# Patient Record
Sex: Female | Born: 1955 | Race: White | Hispanic: No | Marital: Married | Smoking: Never smoker
Health system: Southern US, Community
[De-identification: ages and names within clinical notes are randomized; demographics above are authoritative.]

## PROBLEM LIST (undated history)

## (undated) DIAGNOSIS — I10 Essential (primary) hypertension: Secondary | ICD-10-CM

---

## 2000-11-11 ENCOUNTER — Other Ambulatory Visit: Admission: RE | Admit: 2000-11-11 | Discharge: 2000-11-11 | Payer: Self-pay | Admitting: Internal Medicine

## 2005-01-22 ENCOUNTER — Ambulatory Visit: Payer: Self-pay | Admitting: Oncology

## 2005-01-22 ENCOUNTER — Encounter: Admission: RE | Admit: 2005-01-22 | Discharge: 2005-01-22 | Payer: Self-pay | Admitting: Surgery

## 2005-02-04 ENCOUNTER — Ambulatory Visit (HOSPITAL_COMMUNITY): Admission: RE | Admit: 2005-02-04 | Discharge: 2005-02-04 | Payer: Self-pay | Admitting: Oncology

## 2005-02-05 ENCOUNTER — Ambulatory Visit (HOSPITAL_COMMUNITY): Admission: RE | Admit: 2005-02-05 | Discharge: 2005-02-05 | Payer: Self-pay | Admitting: Oncology

## 2006-01-11 IMAGING — CT CT ABDOMEN W/ CM
1 of 4 series · 14 of 32 positions shown, 19 images · IV contrast (omnipaque)
Comparison: Nuclear medicine PET CT dated 02/04/2005 and breast MRI dated
01/22/2005.

CHEST CT WITH CONTRAST

CLINICAL DATA: Newly diagnosed right breast cancer.
TECHNIQUE: Multidetector CT imaging of the chest, abdomen, and pelvis was
performed following the standard protocol during bolus administration of
intravenous contrast.

Contrast:  150 cc Omnipaque 300

[Series 2: cap w/iv 5.0 b30f · axial · 0.74mm/px · z∈[-487,+43]mm · 14 of 122 slices shown, 19 images]
[im 8/122  soft-tissue]
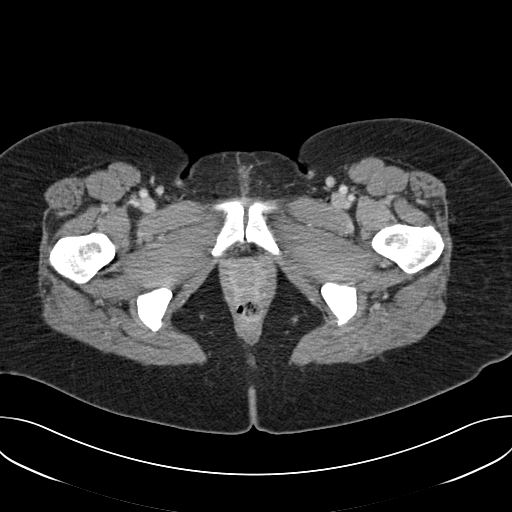
[im 8/122  bone]
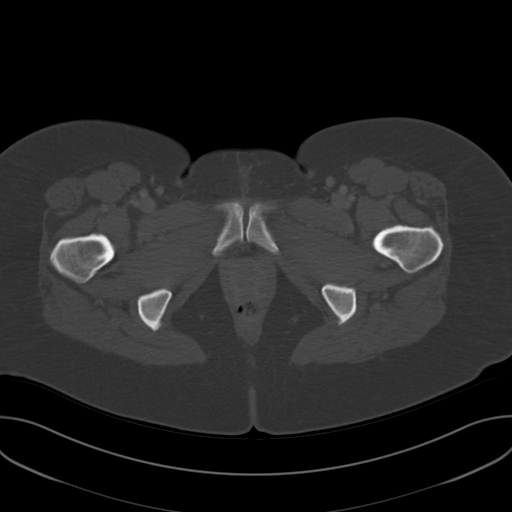
[im 16/122  soft-tissue]
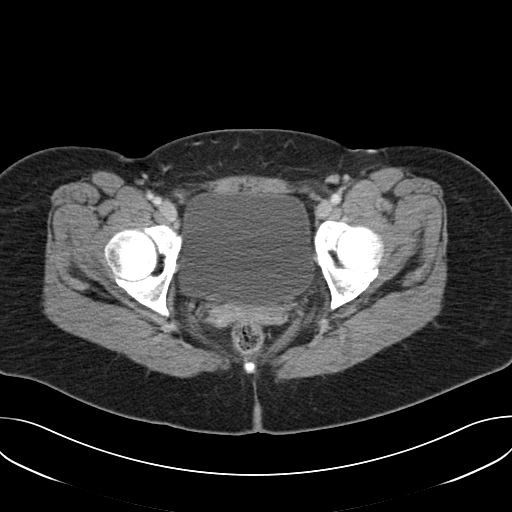
[im 23/122  soft-tissue]
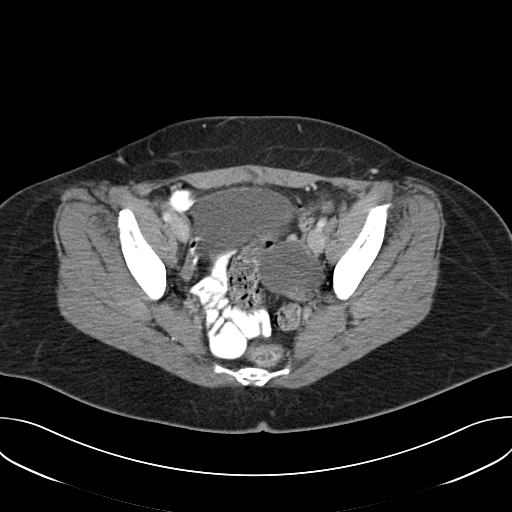
[im 38/122  soft-tissue]
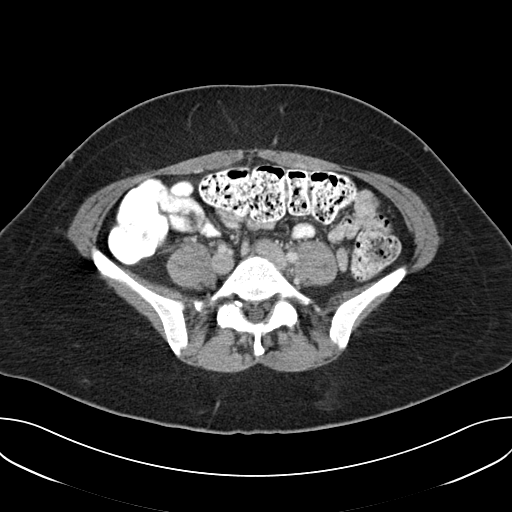
[im 46/122  soft-tissue]
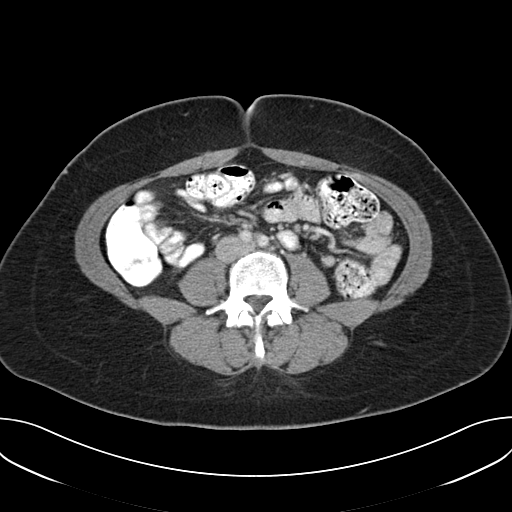
[im 53/122  soft-tissue]
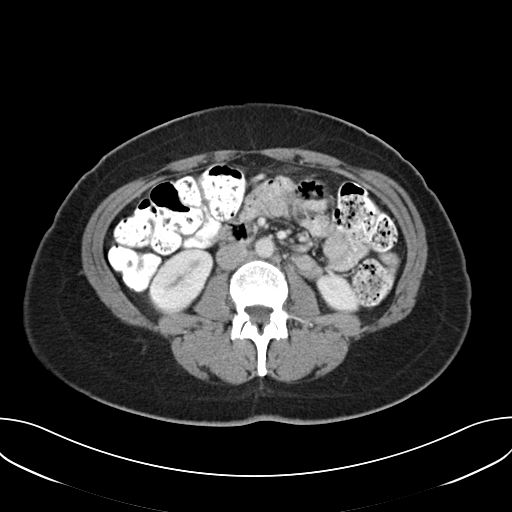
[im 61/122  soft-tissue]
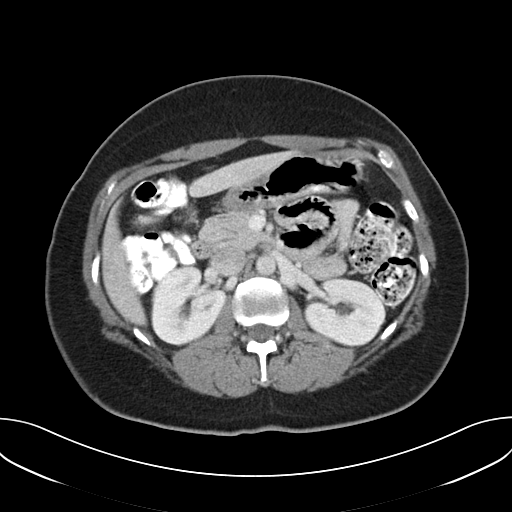
[im 69/122  soft-tissue]
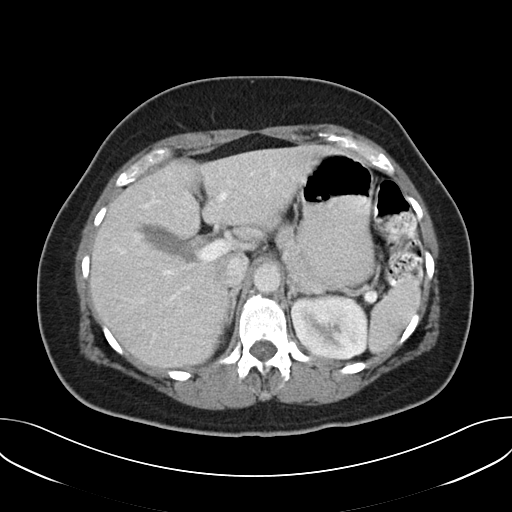
[im 76/122  soft-tissue]
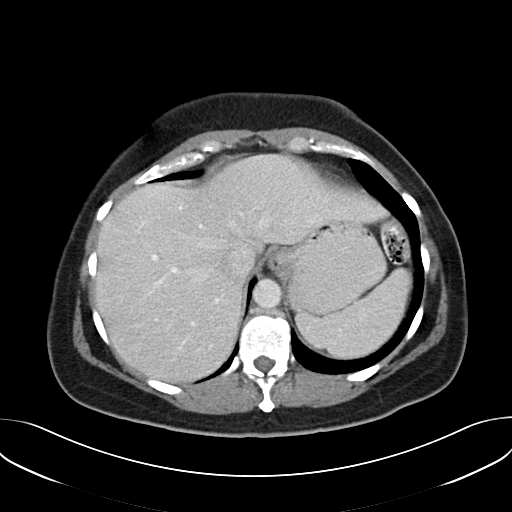
[im 76/122  bone]
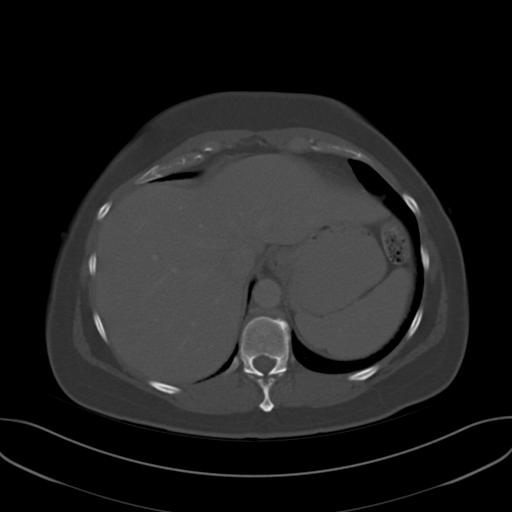
[im 84/122  soft-tissue]
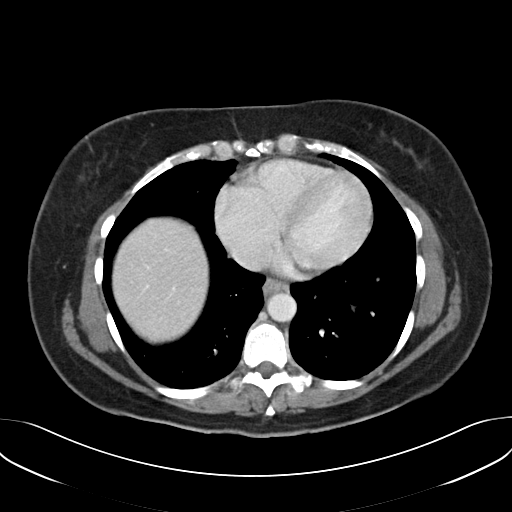
[im 91/122  lung]
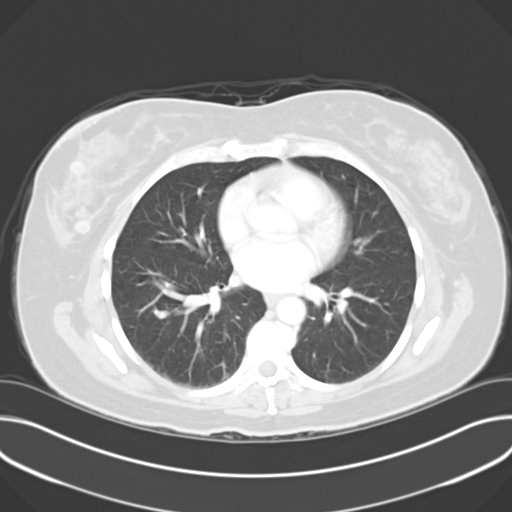
[im 99/122  soft-tissue]
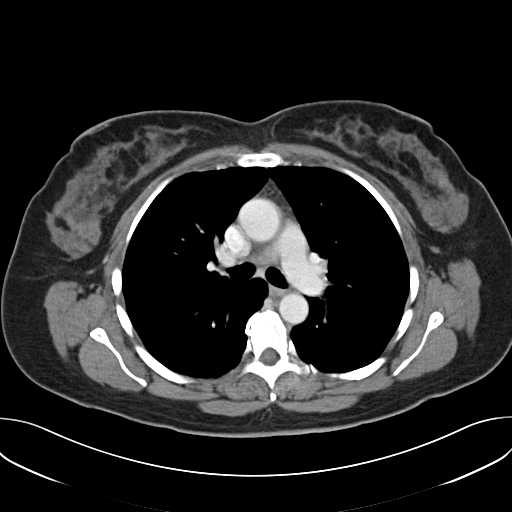
[im 99/122  lung]
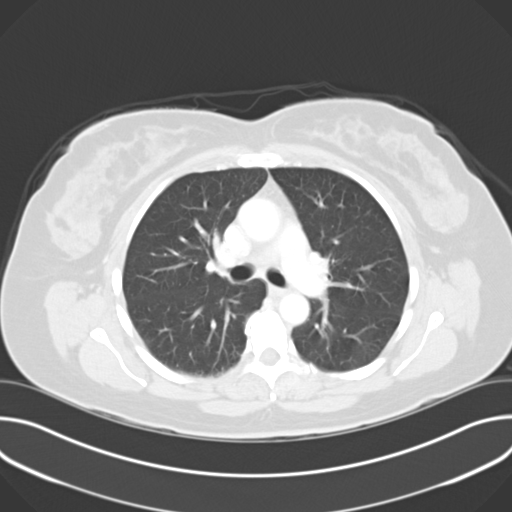
[im 106/122  soft-tissue]
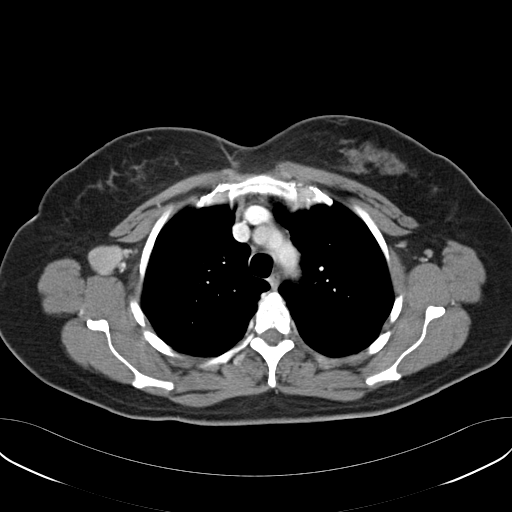
[im 106/122  lung]
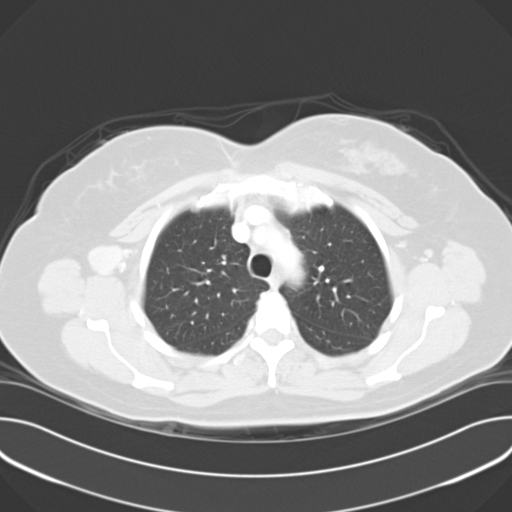
[im 114/122  soft-tissue]
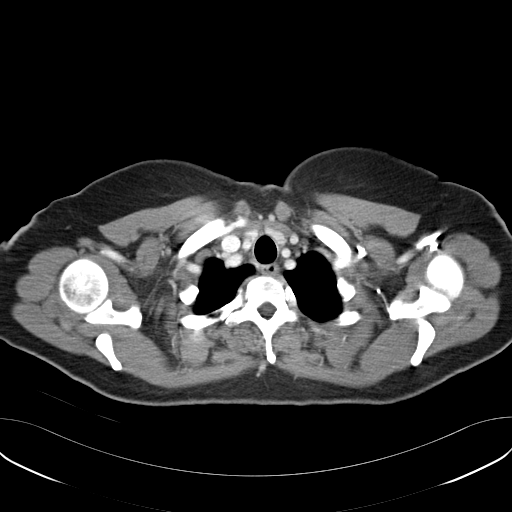
[im 114/122  lung]
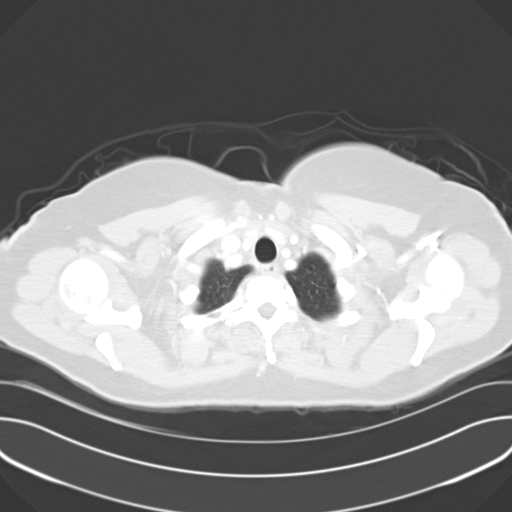

[14 of 32 positions shown; findings below may reference images not displayed]

FINDINGS: Multiple enhancing masses in the lateral aspect of the right breast.
The largest mass measures 2.8 x 2.3 cm in maximum dimensions. Single enlarged,
enhancing right axillary lymph node, measuring 2.7 x 2.2 cm in maximum
dimensions. No lung masses or enlarged lymph nodes within the chest. Thoracic
spine degenerative changes without evidence of bony metastatic disease.

IMPRESSION

1. Multifocal right breast carcinoma with a metastatic right axillary lymph
node.

2. No evidence of intrathoracic metastatic disease.

ABDOMEN CT WITH CONTRAST
FINDINGS: Normal appearing liver, spleen, pancreas, gallbladder, kidneys and
adrenal glands. No gastrointestinal abnormalities or enlarged lymph nodes.
Lumbar spine degenerative changes without evidence of bony metastatic disease.

IMPRESSION

No evidence of metastatic disease in the abdomen.

PELVIS CT WITH CONTRAST
FINDINGS: 4.0 cm simple appearing left ovarian cyst. Normal appearing right
ovary. Nonvisualized uterus, presumed surgically absent. Normal appearing
urinary bladder and pelvic loops of colon and small bowel. No enlarged lymph
nodes. No evidence of bony metastatic disease.

IMPRESSION

1. 4.0 cm simple appearing left ovarian cyst.

2. No evidence of metastatic disease in the pelvis.

## 2010-02-12 ENCOUNTER — Inpatient Hospital Stay (HOSPITAL_COMMUNITY): Admission: RE | Admit: 2010-02-12 | Discharge: 2010-02-15 | Payer: Self-pay | Admitting: Orthopedic Surgery

## 2010-04-30 ENCOUNTER — Ambulatory Visit (HOSPITAL_COMMUNITY): Admission: RE | Admit: 2010-04-30 | Discharge: 2010-04-30 | Payer: Self-pay | Admitting: Orthopedic Surgery

## 2011-03-04 LAB — BASIC METABOLIC PANEL
BUN: 8 mg/dL (ref 6–23)
Calcium: 9.8 mg/dL (ref 8.4–10.5)
Chloride: 104 mEq/L (ref 96–112)
Creatinine, Ser: 0.56 mg/dL (ref 0.4–1.2)
GFR calc Af Amer: >60 mL/min (ref >60)
GFR calc non Af Amer: >60 mL/min (ref >60)

## 2011-03-04 LAB — CBC
MCV: 92 fL (ref 78.0–100.0)
Platelets: 261 10*3/uL (ref 150–400)
RBC: 4.21 MIL/uL (ref 3.87–5.11)
WBC: 5.8 10*3/uL (ref 4.0–10.5)

## 2011-03-07 LAB — CBC
HCT: 40.9 % (ref 36.0–46.0)
Hemoglobin: 14.3 g/dL (ref 12.0–15.0)
RBC: 4.36 MIL/uL (ref 3.87–5.11)
RDW: 13.7 % (ref 11.5–15.5)
WBC: 6.8 10*3/uL (ref 4.0–10.5)

## 2011-03-07 LAB — COMPREHENSIVE METABOLIC PANEL
ALT: 18 U/L (ref 0–35)
Alkaline Phosphatase: 61 U/L (ref 39–117)
BUN: 9 mg/dL (ref 6–23)
Chloride: 103 mEq/L (ref 96–112)
Glucose, Bld: 122 mg/dL — ABNORMAL HIGH (ref 70–99)
Potassium: 3.6 mEq/L (ref 3.5–5.1)
Sodium: 141 mEq/L (ref 135–145)
Total Bilirubin: 0.2 mg/dL — ABNORMAL LOW (ref 0.3–1.2)
Total Protein: 7.5 g/dL (ref 6.0–8.3)

## 2011-03-07 LAB — URINALYSIS, ROUTINE W REFLEX MICROSCOPIC
Glucose, UA: NEGATIVE mg/dL
Ketones, ur: NEGATIVE mg/dL
Nitrite: NEGATIVE
Specific Gravity, Urine: 1.008 (ref 1.005–1.030)
pH: 5.5 (ref 5.0–8.0)

## 2011-03-07 LAB — TYPE AND SCREEN: Antibody Screen: NEGATIVE

## 2011-03-07 LAB — PROTIME-INR: Prothrombin Time: 12.9 seconds (ref 11.6–15.2)

## 2011-03-11 LAB — BASIC METABOLIC PANEL
CO2: 28 mEq/L (ref 19–32)
CO2: 33 mEq/L — ABNORMAL HIGH (ref 19–32)
Calcium: 8.9 mg/dL (ref 8.4–10.5)
Chloride: 100 mEq/L (ref 96–112)
Chloride: 97 mEq/L (ref 96–112)
GFR calc Af Amer: >60 mL/min (ref >60)
GFR calc Af Amer: >60 mL/min (ref >60)
GFR calc non Af Amer: >60 mL/min (ref >60)
GFR calc non Af Amer: >60 mL/min (ref >60)
Glucose, Bld: 126 mg/dL — ABNORMAL HIGH (ref 70–99)
Potassium: 3.5 mEq/L (ref 3.5–5.1)
Potassium: 3.6 mEq/L (ref 3.5–5.1)
Potassium: 4.2 mEq/L (ref 3.5–5.1)
Sodium: 130 mEq/L — ABNORMAL LOW (ref 135–145)
Sodium: 139 mEq/L (ref 135–145)
Sodium: 140 mEq/L (ref 135–145)

## 2011-03-11 LAB — CBC
HCT: 28.8 % — ABNORMAL LOW (ref 36.0–46.0)
HCT: 29 % — ABNORMAL LOW (ref 36.0–46.0)
HCT: 32.4 % — ABNORMAL LOW (ref 36.0–46.0)
Hemoglobin: 10 g/dL — ABNORMAL LOW (ref 12.0–15.0)
Hemoglobin: 11.3 g/dL — ABNORMAL LOW (ref 12.0–15.0)
Hemoglobin: 9.9 g/dL — ABNORMAL LOW (ref 12.0–15.0)
MCV: 93.2 fL (ref 78.0–100.0)
MCV: 94.5 fL (ref 78.0–100.0)
RBC: 3.09 MIL/uL — ABNORMAL LOW (ref 3.87–5.11)
RBC: 3.46 MIL/uL — ABNORMAL LOW (ref 3.87–5.11)
RDW: 13.7 % (ref 11.5–15.5)
WBC: 9.5 10*3/uL (ref 4.0–10.5)

## 2011-03-11 LAB — PROTIME-INR
INR: 1.03 (ref 0.00–1.49)
Prothrombin Time: 19.2 seconds — ABNORMAL HIGH (ref 11.6–15.2)

## 2013-06-15 ENCOUNTER — Emergency Department (HOSPITAL_COMMUNITY): Payer: 59

## 2013-06-15 ENCOUNTER — Emergency Department (HOSPITAL_COMMUNITY)
Admission: EM | Admit: 2013-06-15 | Discharge: 2013-06-15 | Disposition: A | Discharge status: Home / Self Care | Payer: 59 | Attending: Emergency Medicine | Admitting: Emergency Medicine

## 2013-06-15 ENCOUNTER — Encounter (HOSPITAL_COMMUNITY): Payer: Self-pay | Admitting: Emergency Medicine

## 2013-06-15 DIAGNOSIS — R079 Chest pain, unspecified: Secondary | ICD-10-CM | POA: Insufficient documentation

## 2013-06-15 DIAGNOSIS — J159 Unspecified bacterial pneumonia: Secondary | ICD-10-CM | POA: Insufficient documentation

## 2013-06-15 DIAGNOSIS — R52 Pain, unspecified: Secondary | ICD-10-CM | POA: Insufficient documentation

## 2013-06-15 DIAGNOSIS — J189 Pneumonia, unspecified organism: Secondary | ICD-10-CM

## 2013-06-15 DIAGNOSIS — IMO0001 Reserved for inherently not codable concepts without codable children: Secondary | ICD-10-CM | POA: Insufficient documentation

## 2013-06-15 DIAGNOSIS — R059 Cough, unspecified: Secondary | ICD-10-CM | POA: Insufficient documentation

## 2013-06-15 DIAGNOSIS — Z79899 Other long term (current) drug therapy: Secondary | ICD-10-CM | POA: Insufficient documentation

## 2013-06-15 DIAGNOSIS — R05 Cough: Secondary | ICD-10-CM | POA: Insufficient documentation

## 2013-06-15 DIAGNOSIS — R509 Fever, unspecified: Secondary | ICD-10-CM | POA: Insufficient documentation

## 2013-06-15 DIAGNOSIS — I10 Essential (primary) hypertension: Secondary | ICD-10-CM | POA: Insufficient documentation

## 2013-06-15 HISTORY — DX: Essential (primary) hypertension: I10

## 2013-06-15 LAB — BASIC METABOLIC PANEL
BUN: 9 mg/dL (ref 6–23)
CO2: 19 mEq/L (ref 19–32)
Chloride: 98 mEq/L (ref 96–112)
Glucose, Bld: 143 mg/dL — ABNORMAL HIGH (ref 70–99)
Potassium: 3.3 mEq/L — ABNORMAL LOW (ref 3.5–5.1)
Sodium: 132 mEq/L — ABNORMAL LOW (ref 135–145)

## 2013-06-15 LAB — CBC
HCT: 37.2 % (ref 36.0–46.0)
Hemoglobin: 12.9 g/dL (ref 12.0–15.0)
MCHC: 34.7 g/dL (ref 30.0–36.0)
RBC: 4.16 MIL/uL (ref 3.87–5.11)

## 2013-06-15 LAB — POCT I-STAT TROPONIN I

## 2013-06-15 MED ORDER — ACETAMINOPHEN 325 MG PO TABS
650.0000 mg | ORAL_TABLET | Freq: Once | ORAL | Status: AC
  Administered 2013-06-15: 650 mg via ORAL
  Filled 2013-06-15: qty 2

## 2013-06-15 MED ORDER — AZITHROMYCIN 250 MG PO TABS: 250.0000 mg | ORAL_TABLET | Freq: Every day | ORAL | Status: DC

## 2013-06-15 MED ORDER — AZITHROMYCIN 250 MG PO TABS
500.0000 mg | ORAL_TABLET | Freq: Once | ORAL | Status: AC
  Administered 2013-06-15: 500 mg via ORAL
  Filled 2013-06-15: qty 2

## 2013-06-15 MED ORDER — DEXTROSE 5 % IV SOLN
1.0000 g | Freq: Once | INTRAVENOUS | Status: AC
  Administered 2013-06-15: 1 g via INTRAVENOUS
  Filled 2013-06-15: qty 10

## 2013-06-15 NOTE — ED Notes (Signed)
Pt c/o SOB. Chest tightness, fever and cough with body aches; pt sts not feeling well x 2 weeks worse over last couple of days

## 2013-06-15 NOTE — ED Provider Notes (Signed)
Medical screening examination/treatment/procedure(s) were performed by non-physician practitioner and as supervising physician I was immediately available for consultation/collaboration.    Nelia Shi, MD 06/15/13 [phone]

## 2013-06-15 NOTE — ED Provider Notes (Signed)
History    CSN: [id number] Arrival date & time 06/15/13  1152  First MD Initiated Contact with Patient 06/15/13 1212     Chief Complaint  Patient presents with  . Shortness of Breath  . Chest Pain  . Cough  . Generalized Body Aches  . Fever   (Consider location/radiation/quality/duration/timing/severity/associated sxs/prior Treatment) HPI Comments: Pt started with cp, sob cough and bodyaches about 2 weeks ago:pt states that the symptoms have progressively gotten worse:pt state that she saw her doctor last week and she was started on an inhaler:pt is unsure of when her fever started:pt states that the has generalized myalgias  The history is provided by the patient. No language interpreter was used.   Past Medical History  Diagnosis Date  . Hypertension    History reviewed. No pertinent past surgical history. History reviewed. No pertinent family history. History  Substance Use Topics  . Smoking status: Never Smoker   . Smokeless tobacco: Not on file  . Alcohol Use: Yes     Comment: occ   OB History   Grav Para Term Preterm Abortions TAB SAB Ect Mult Living                 Review of Systems  Constitutional: Negative.   Respiratory: Negative.   Cardiovascular: Negative.     Allergies  Sulfa antibiotics  Home Medications   Current Outpatient Rx  Name  Route  Sig  Dispense  Refill  . albuterol (PROVENTIL HFA;VENTOLIN HFA) 108 (90 BASE) MCG/ACT inhaler   Inhalation   Inhale 2 puffs into the lungs every 4 (four) hours as needed for wheezing or shortness of breath.         . Cholecalciferol (VITAMIN D PO)   Oral   Take 1 tablet by mouth daily.         Marland Kitchen escitalopram (LEXAPRO) 10 MG tablet   Oral   Take 10 mg by mouth daily.         Marland Kitchen LORazepam (ATIVAN) 0.5 MG tablet   Oral   Take 0.5 mg by mouth daily.         . Multiple Vitamin (MULTIVITAMIN WITH MINERALS) TABS   Oral   Take 1 tablet by mouth daily.         Marland Kitchen olmesartan (BENICAR) 5 MG  tablet   Oral   Take 5 mg by mouth daily.         Marland Kitchen VITAMIN A PO   Oral   Take 1,000 Units by mouth daily.          BP 140/68  Pulse 102  Temp(Src) 103 F (39.4 C) (Oral)  SpO2 95% Physical Exam  Nursing note and vitals reviewed. Constitutional: She is oriented to person, place, and time. She appears well-developed and well-nourished.  HENT:  Head: Normocephalic and atraumatic.  Eyes: Conjunctivae and EOM are normal.  Neck: Normal range of motion. Neck supple.  Cardiovascular: Normal rate and regular rhythm.   Pulmonary/Chest: Effort normal and breath sounds normal.  Right mastectomy  Abdominal: Soft. Bowel sounds are normal. There is no tenderness.  Musculoskeletal: Normal range of motion.  Neurological: She is alert and oriented to person, place, and time.  Skin: Skin is warm and dry.  Psychiatric: She has a normal mood and affect.    ED Course  Procedures (including critical care time) Labs Reviewed  BASIC METABOLIC PANEL - Abnormal; Notable for the following:    Sodium 132 (*)    Potassium 3.3 (*)  Glucose, Bld 143 (*)    All other components within normal limits  CBC  POCT I-STAT TROPONIN I   Date: 06/15/2013  Rate: 107  Rhythm: sinus tachycardia  QRS Axis: right  Intervals: normal  ST/T Wave abnormalities: nonspecific ST changes  Conduction Disutrbances:none  Narrative Interpretation:   Old EKG Reviewed: none available   Dg Chest 2 View  06/15/2013   *RADIOLOGY REPORT*  Clinical Data: Shortness of breath, cough, fever, chest pain  CHEST - 2 VIEW  Comparison: CT chest dated 02/05/2005  Findings: Mild patchy left lower lobe opacity, suspicious for pneumonia.  Mild patchy right basilar opacity with the eventration of the right hemidiaphragm, possibly atelectasis.  Mild cardiomegaly.  Surgical clips in the right chest wall / axilla.  Degenerative changes of the visualized thoracolumbar spine.  IMPRESSION: Mild patchy left lower lobe opacity, suspicious for  pneumonia.   Original Report Authenticated By: Charline Bills, M.D.   1. Community acquired pneumonia     MDM  Pt vitals are stable:will send home with abx;pt instructed on symptoms for return   Teressa Lower, NP 06/15/13 [phone]

## 2016-10-08 ENCOUNTER — Ambulatory Visit: Payer: Managed Care, Other (non HMO) | Attending: Sports Medicine | Admitting: Physical Therapy

## 2016-10-08 ENCOUNTER — Encounter: Payer: Self-pay | Admitting: Physical Therapy

## 2016-10-08 DIAGNOSIS — M25611 Stiffness of right shoulder, not elsewhere classified: Secondary | ICD-10-CM | POA: Diagnosis present

## 2016-10-08 DIAGNOSIS — M25511 Pain in right shoulder: Secondary | ICD-10-CM | POA: Diagnosis present

## 2016-10-08 DIAGNOSIS — G8929 Other chronic pain: Secondary | ICD-10-CM

## 2016-10-08 DIAGNOSIS — M25631 Stiffness of right wrist, not elsewhere classified: Secondary | ICD-10-CM

## 2016-10-08 DIAGNOSIS — I972 Postmastectomy lymphedema syndrome: Secondary | ICD-10-CM | POA: Diagnosis present

## 2016-10-08 NOTE — Therapy (Signed)
[place], [place], [address] Phone: [phone]   Fax:  [phone]  Physical Therapy Evaluation  Patient Details  Name: Tina Odonnell MRN: [id number] Date of Birth: Feb 17, 1956 Referring Provider: Deneen Harts  Encounter Date: 10/08/2016      PT End of Session - 10/08/16 1041    Visit Number 1   Number of Visits 13   Date for PT Re-Evaluation 11/05/16   PT Start Time 0803   PT Stop Time 0931  treatment began by Val PTA at 8:52   PT Time Calculation (min) 88 min   Activity Tolerance Patient tolerated treatment well   Behavior During Therapy [facility] for tasks assessed/performed      Past Medical History:  Diagnosis Date  . Hypertension     History reviewed. No pertinent surgical history.  There were no vitals filed for this visit.       Subjective Assessment - 10/08/16 0809    Subjective I had a mastectomy 11 years ago and had lymphedema in my right arm. My swelling was controlled with a compression sleeve until July 29th when I fell and broke my radius at my wrist. I had surgery on August 2nd and have been unable to wear my sleeve since. I have tingling that is unbearable in my wrist when I try to wear my sleeve and my hand became very swollen. I have not had to wear a glove before. I am also having decreased range of motion in my right arm since I fell.    Pertinent History R breast cancer with R mastectomy in 2006, completed chemotherapy and radiation, penicillin allergy - causes renal failure, fractured R radius in July 2017 - surgery Aug 2017, hypertension, R TKA 2011   Patient Stated Goals to reduce pain in RUE   Currently in Pain? Yes   Pain Score 8    Pain Location Arm   Pain Orientation Right   Pain Descriptors / Indicators Other (Comment)  Itch   Pain Type Chronic pain   Pain Radiating Towards to R shoulder   Pain Onset More than a month ago   Pain Frequency Constant   Aggravating  Factors  nothing in particular   Pain Relieving Factors nothing   Effect of Pain on Daily Activities she is not doing nearly as much as she used to - she has to eat left handed but is able to write with her right hand, difficult to get bra on and off            [facility] PT Assessment - 10/08/16 0001      Assessment   Medical Diagnosis right breast cancer   Referring Provider Deneen Harts   Onset Date/Surgical Date 02/11/05  R mastectomy, 07/17/16 - to fix R radial fracture   Hand Dominance Right   Prior Therapy had an eval for decreased wrist ROM 10/04/16     Precautions   Precautions Other (comment)  lymphedema     Restrictions   Weight Bearing Restrictions No     Balance Screen   Has the patient fallen in the past 6 months Yes   How many times? 1  misstep out back stairs   Has the patient had a decrease in activity level because of a fear of falling?  No   Is the patient reluctant to leave their home because of a fear of falling?  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   Leisure pt reports she exercises very little     Cognition   Overall Cognitive Status Within Functional Limits for tasks assessed     Observation/Other Assessments   Other Surveys  --  LLIS: 72%     AROM   Right Shoulder Flexion 140 Degrees   Right Shoulder ABduction 110 Degrees   Right Shoulder  Internal Rotation 22 Degrees   Right Shoulder External Rotation 80 Degrees   Left Shoulder Flexion 179 Degrees   Left Shoulder ABduction 176 Degrees   Left Shoulder Internal Rotation 65 Degrees   Left Shoulder External Rotation 82 Degrees   Right Wrist Extension 33 Degrees   Right Wrist Flexion 30 Degrees   Left Wrist Extension 75 Degrees   Left Wrist Flexion 70 Degrees     Strength   Overall Strength Other (comment)  L WFL except for abd 3+/5, R not tested secondary to Pain           LYMPHEDEMA/ONCOLOGY QUESTIONNAIRE - 10/08/16 0843      Type   Cancer Type right breast cancer     Surgeries   Mastectomy Date 07/13/05   Axillary Lymph Node Dissection Date 07/13/05   Number Lymph Nodes Removed 12     Date Lymphedema/Swelling Started   Date 02/13/05     Treatment   Active Chemotherapy Treatment No   Past Chemotherapy Treatment Yes   Active Radiation Treatment No   Past Radiation Treatment Yes   Current Hormone Treatment No   Past Hormone Therapy No     What other symptoms do you have   Are you Having Heaviness or Tightness Yes   Are you having Pain Yes   Are you having pitting edema No   Is it Hard or Difficult finding clothes that fit Yes   Is there Decreased scar mobility Yes     Lymphedema Assessments   Lymphedema Assessments Upper extremities     Right Upper Extremity Lymphedema   15 cm Proximal to Olecranon Process 40.2 cm   Olecranon Process 32.8 cm   15 cm Proximal to Ulnar Styloid Process 30.5 cm   10 cm Proximal to Ulnar Styloid Process 25.5 cm   Just Proximal to Ulnar Styloid Process 18 cm   Across Hand at [employer] 18.6 cm   At [place] of 2nd Digit 6.4 cm     Left Upper Extremity Lymphedema   15 cm Proximal to Olecranon Process 39.5 cm   Olecranon Process 28.7 cm   15 cm Proximal to Ulnar Styloid Process 26.8 cm   10 cm Proximal to Ulnar Styloid Process 23 cm   Just Proximal to Ulnar Styloid Process 15.8 cm   Across Hand at [employer]  17.9 cm   At [place] of 2nd Digit 5.8 cm           Quick Dash - 10/08/16 0001  Open a tight or new jar Severe difficulty   Do heavy household chores (wash walls, wash floors) Moderate difficulty   Carry a shopping bag or briefcase Moderate difficulty   Wash your back Unable   Use a knife to cut food Severe difficulty   Recreational activities in which you take some force or impact through your arm, shoulder, or hand (golf, hammering, tennis) Unable   During the past week, to what extent has your arm, shoulder or hand problem interfered with your normal social activities with family, friends, neighbors, or groups? Quite a bit   During the past week, to what extent has your arm, shoulder or hand problem limited your work or other regular daily activities Quite a bit   Arm, shoulder, or hand pain. Severe   Tingling (pins and needles) in your arm, shoulder, or hand Severe   Difficulty Sleeping Moderate difficulty   DASH Score 72.73 %             [facility] Adult PT Treatment/Exercise - 10/08/16 0001      Manual Therapy   Manual Lymphatic Drainage (MLD) In Supine: Short neck, superficial and deep abdominals, Rt inguinal nodes and Rt axillo-inguinal anastomosis, Lt axillary nodes, Lt upper anterior quadrant intact sequence, anterior inter-axillary anastomosis, and then Rt UE from dorsal hand to lateral shoulder working proximal from distal then retracing all steps.    Compression Bandaging Biotone lotion applied, thin stockinette, Elastomull to fingers 1-4 (left out 5th finger so pt can type at work); Artiflex, and then 1-6 cm, 1-10 cm, and 2-12 cm short stretch compression bandages from hand to axilla.                 PT Education - 10/08/16 1209    Education provided Yes   Education Details Pt instructed in anatomy of lymphatic system in regards to her lymphedema and principles of manual lymph drainage. Also instructed pt in remedial ROM exercises to perform with bandaging and if  she has any pain/tingling that these exercises don't alleviate to remove bandages and bring them all back.   Person(s) Educated Patient   Methods Explanation;Demonstration   Comprehension Verbalized understanding;Returned demonstration;Need further instruction                [facility] Goals - 10/08/16 1053      CC Long Term Goal  #1   Title Patient will demonstrate 170 degrees of right shoulder flexion to allow her to reach items overhead   Baseline 140   Time 4   Period Weeks   Status New     CC Long Term Goal  #2   Title Patient will demonstrate 170 degrees of right shoulder abduction to allow her to reach items out to sides   Baseline 110   Time 4   Period Weeks   Status New     CC Long Term Goal  #3   Title Patient will demonstrate 60 degrees of right shoulder internal rotation to allow her to don her bra without difficulty   Baseline 22   Time 4   Period Weeks   Status New     CC Long Term Goal  #4   Title Patient will obtain appropriate compression sleeve and glove for long term management of lymphedema   Time 4   Period Weeks   Status New     CC Long Term Goal  #5   Title Patient will demonstrate 75% of full wrist ROM to allow  patient to return to prior level of function   Baseline ~25%   Time 4   Period Weeks   Status New     CC Long Term Goal  #6   Title Patient will be independent in self manual lymphatic drainage for long term management of RUE lymphedema   Time 4   Period Weeks   Status New     Additional Goals   Additional Goals Yes            Plan - 10/08/16 1042    Clinical Impression Statement Patient presents to PT with R UE lymphedema. She had a mastectomy 11 years ago for R breast cancer and underwent chemotherapy and radiation. She has had lymphedema since a few months after her surgery 11 years ago but has managed it with a compression sleeve. In July of 2017 she fell down the steps and fractured her right radius which  caused her R UE lymphedema to worsen. When she tries to wear her compression sleeve she has discomfort and and pain in her hand and she demonstrates increased edema of her hand. She also reports she has lost ROM in her R shoulder since her fall. She also has decreased right wrist ROM.  Patient would benefit from skilled PT services to increase R shoulder and wrist ROM and strength, decrease R UE edema and to assist patient with obtaining appropriate compression garments. She also reports decreased scar mobility which will be addressed.    Rehab Potential Good   Clinical Impairments Affecting Rehab Potential hx of radiation   PT Frequency 3x / week   PT Duration 4 weeks   PT Treatment/Interventions Vasopneumatic Device;Taping;Passive range of motion;Scar mobilization;Compression bandaging;Manual lymph drainage;Manual techniques;Patient/family education;Orthotic Fit/Training;Therapeutic exercise;DME Instruction;ADLs/Self Care Home Management   PT Next Visit Plan compression bandaging and MLD for RUE, ROM exercises for R shoulder and wrist   Consulted and Agree with Plan of Care Patient      Patient will benefit from skilled therapeutic intervention in order to improve the following deficits and impairments:  Pain, Decreased scar mobility, Decreased range of motion, Decreased strength, Impaired UE functional use, Increased edema  Visit Diagnosis: Postmastectomy lymphedema - Plan: PT plan of care cert/re-cert  Stiffness of right shoulder, not elsewhere classified - Plan: PT plan of care cert/re-cert  Stiffness of right wrist, not elsewhere classified - Plan: PT plan of care cert/re-cert  Chronic right shoulder pain - Plan: PT plan of care cert/re-cert     Problem List There are no active problems to display for this patient.   Alexia Freestone 10/08/2016, 12:28 PM  [place], [place], [address] Phone:  [phone]   Fax:  [phone]  Name: Tina Odonnell MRN: [id number] Date of Birth: 1956-01-18  Allyson Sabal, PT 10/08/16 12:29 PM

## 2016-10-09 NOTE — Therapy (Addendum)
[place], [place], [address] Phone: [phone]   Fax:  [phone]  Physical Therapy Treatment  Patient Details  Name: Tina Odonnell MRN: [id number] Date of Birth: [birth year] Referring Provider: Deneen Harts  Encounter Date: 10/08/2016      PT End of Session - 10/08/16 1041    Visit Number 1   Number of Visits 13   Date for PT Re-Evaluation 11/05/16   PT Start Time 0803   PT Stop Time 0931  treatment began by Val PTA at 8:52   PT Time Calculation (min) 88 min   Activity Tolerance Patient tolerated treatment well   Behavior During Therapy [facility] for tasks assessed/performed      Past Medical History:  Diagnosis Date  . Hypertension     History reviewed. No pertinent surgical history.  There were no vitals filed for this visit.      Subjective Assessment - 10/08/16 0809    Subjective I had a mastectomy 11 years ago and had lymphedema in my right arm. My swelling was controlled with a compression sleeve until July 29th when I fell and broke my radius at my wrist. I had surgery on August 2nd and have been unable to wear my sleeve since. I have tingling that is unbearable in my wrist when I try to wear my sleeve and my hand became very swollen. I have not had to wear a glove before. I am also having decreased range of motion in my right arm since I fell.    Pertinent History R breast cancer with R mastectomy in 2006, completed chemotherapy and radiation, penicillin allergy - causes renal failure, fractured R radius in July 2017 - surgery Aug 2017, hypertension, R TKA 2011   Patient Stated Goals to reduce pain in RUE   Currently in Pain? Yes   Pain Score 8    Pain Location Arm   Pain Orientation Right   Pain Descriptors / Indicators Other (Comment)  Itch   Pain Type Chronic pain   Pain Radiating Towards to R shoulder   Pain Onset More than a month ago   Pain Frequency Constant   Aggravating  Factors  nothing in particular   Pain Relieving Factors nothing   Effect of Pain on Daily Activities she is not doing nearly as much as she used to - she has to eat left handed but is able to write with her right hand, difficult to get bra on and off               LYMPHEDEMA/ONCOLOGY QUESTIONNAIRE - 10/08/16 0843      Type   Cancer Type right breast cancer     Surgeries   Mastectomy Date 07/13/05   Axillary Lymph Node Dissection Date 07/13/05   Number Lymph Nodes Removed 12     Date Lymphedema/Swelling Started   Date 02/13/05     Treatment   Active Chemotherapy Treatment No   Past Chemotherapy Treatment Yes   Active Radiation Treatment No   Past Radiation Treatment Yes   Current Hormone Treatment No   Past Hormone Therapy No     What other symptoms do you have   Are you Having Heaviness or Tightness Yes   Are you having Pain Yes   Are you having pitting edema No   Is it Hard or Difficult finding clothes that fit Yes   Is there Decreased scar mobility Yes     Lymphedema Assessments  Lymphedema Assessments Upper extremities     Right Upper Extremity Lymphedema   15 cm Proximal to Olecranon Process 40.2 cm   Olecranon Process 32.8 cm   15 cm Proximal to Ulnar Styloid Process 30.5 cm   10 cm Proximal to Ulnar Styloid Process 25.5 cm   Just Proximal to Ulnar Styloid Process 18 cm   Across Hand at [employer] 18.6 cm   At [place] of 2nd Digit 6.4 cm     Left Upper Extremity Lymphedema   15 cm Proximal to Olecranon Process 39.5 cm   Olecranon Process 28.7 cm   15 cm Proximal to Ulnar Styloid Process 26.8 cm   10 cm Proximal to Ulnar Styloid Process 23 cm   Just Proximal to Ulnar Styloid Process 15.8 cm   Across Hand at [employer] 17.9 cm   At [place] of 2nd Digit 5.8 cm       In Supine: Short neck, superficial and deep abdominals, Rt inguinal nodes and Rt axillo-inguinal anastomosis, Lt axillary nodes, Lt upper anterior quadrant intact sequence, anterior  inter-axillary anastomosis, and then Rt UE from dorsal hand to lateral shoulder working proximal from distal then retracing all steps.     Biotone lotion applied, thin stockinette, Elastomull to fingers 1-4 (left out 5th finger so pt can type at work); Artiflex, and then 1-6 cm, 1-10 cm, and 2-12 cm short stretch compression bandages from hand to axilla.                  PT Education - 10/08/16 1209    Education provided Yes   Education Details Pt instructed in anatomy of lymphatic system in regards to her lymphedema and principles of manual lymph drainage. Also instructed pt in remedial ROM exercises to perform with bandaging and if she has any pain/tingling that these exercises don't alleviate to remove bandages and bring them all back.   Person(s) Educated Patient   Methods Explanation;Demonstration   Comprehension Verbalized understanding;Returned demonstration;Need further instruction                [facility] Goals - 10/08/16 1053      CC Long Term Goal  #1   Title Patient will demonstrate 170 degrees of right shoulder flexion to allow her to reach items overhead   Baseline 140   Time 4   Period Weeks   Status New     CC Long Term Goal  #2   Title Patient will demonstrate 170 degrees of right shoulder abduction to allow her to reach items out to sides   Baseline 110   Time 4   Period Weeks   Status New     CC Long Term Goal  #3   Title Patient will demonstrate 60 degrees of right shoulder internal rotation to allow her to don her bra without difficulty   Baseline 22   Time 4   Period Weeks   Status New     CC Long Term Goal  #4   Title Patient will obtain appropriate compression sleeve and glove for long term management of lymphedema   Time 4   Period Weeks   Status New     CC Long Term Goal  #5   Title Patient will demonstrate 75% of full wrist ROM to allow patient to return to prior level of function   Baseline ~25%   Time 4   Period  Weeks   Status New     CC Long  Term Goal  #6   Title Patient will be independent in self manual lymphatic drainage for long term management of RUE lymphedema   Time 4   Period Weeks   Status New     Additional Goals   Additional Goals Yes            Plan - 10/08/16 1042    Clinical Impression Statement Patient presents to PT with R UE lymphedema. She had a mastectomy 11 years ago for R breast cancer and underwent chemotherapy and radiation. She has had lymphedema since a few months after her surgery 11 years ago but has managed it with a compression sleeve. In July of 2017 she fell down the steps and fractured her right radius which caused her R UE lymphedema to worsen. When she tries to wear her compression sleeve she has discomfort and and pain in her hand and she demonstrates increased edema of her hand. She also reports she has lost ROM in her R shoulder since her fall. She also has decreased right wrist ROM.  Patient would benefit from skilled PT services to increase R shoulder and wrist ROM and strength, decrease R UE edema and to assist patient with obtaining appropriate compression garments. She also reports decreased scar mobility which will be addressed.    Rehab Potential Good   Clinical Impairments Affecting Rehab Potential hx of radiation   PT Frequency 3x / week   PT Duration 4 weeks   PT Treatment/Interventions Vasopneumatic Device;Taping;Passive range of motion;Scar mobilization;Compression bandaging;Manual lymph drainage;Manual techniques;Patient/family education;Orthotic Fit/Training;Therapeutic exercise;DME Instruction;ADLs/Self Care Home Management   PT Next Visit Plan compression bandaging and MLD for RUE, ROM exercises for R shoulder and wrist   Consulted and Agree with Plan of Care Patient      Patient will benefit from skilled therapeutic intervention in order to improve the following deficits and impairments:  Pain, Decreased scar mobility, Decreased range of  motion, Decreased strength, Impaired UE functional use, Increased edema  Visit Diagnosis: Postmastectomy lymphedema - Plan: PT plan of care cert/re-cert  Stiffness of right shoulder, not elsewhere classified - Plan: PT plan of care cert/re-cert  Stiffness of right wrist, not elsewhere classified - Plan: PT plan of care cert/re-cert  Chronic right shoulder pain - Plan: PT plan of care cert/re-cert     Problem List There are no active problems to display for this patient.   Otelia Limes, PTA 10/09/2016, 8:25 AM  [place], [place], [address] Phone: [phone]   Fax:  [phone]  Name: Tina Odonnell MRN: [id number] Date of Birth: March 11, 1956   Allyson Sabal, PT 10/10/16 10:16 AM

## 2016-10-10 ENCOUNTER — Ambulatory Visit: Payer: Managed Care, Other (non HMO) | Admitting: Physical Therapy

## 2016-10-10 DIAGNOSIS — G8929 Other chronic pain: Secondary | ICD-10-CM

## 2016-10-10 DIAGNOSIS — M25631 Stiffness of right wrist, not elsewhere classified: Secondary | ICD-10-CM

## 2016-10-10 DIAGNOSIS — I972 Postmastectomy lymphedema syndrome: Secondary | ICD-10-CM

## 2016-10-10 DIAGNOSIS — M25511 Pain in right shoulder: Secondary | ICD-10-CM

## 2016-10-10 DIAGNOSIS — M25611 Stiffness of right shoulder, not elsewhere classified: Secondary | ICD-10-CM

## 2016-10-10 NOTE — Therapy (Signed)
[place], [place], [address] Phone: [phone]   Fax:  [phone]  Physical Therapy Treatment  Patient Details  Name: Tina Odonnell MRN: [id number] Date of Birth: [birth year] Referring Provider: Deneen Harts  Encounter Date: 10/10/2016      PT End of Session - 10/10/16 1251    Visit Number 2   Number of Visits 13   Date for PT Re-Evaluation 11/05/16   PT Start Time 0800   PT Stop Time 0845   PT Time Calculation (min) 45 min   Activity Tolerance Patient tolerated treatment well   Behavior During Therapy [facility] for tasks assessed/performed      Past Medical History:  Diagnosis Date  . Hypertension     No past surgical history on file.  There were no vitals filed for this visit.      Subjective Assessment - 10/10/16 0759    Subjective I took the wrap off yesterday because it got too tight and uncomfortable but when I did, I noticed an improvement in my arm.  It's been off for about 24 hours.   Pertinent History R breast cancer with R mastectomy in 2006, completed chemotherapy and radiation, penicillin allergy - causes renal failure, fractured R radius in July 2017 - surgery Aug 2017, hypertension, R TKA 2011   Patient Stated Goals to reduce pain in RUE   Currently in Pain? Yes   Pain Score 6    Pain Location Arm   Pain Orientation Right   Pain Descriptors / Indicators Aching   Pain Type Chronic pain   Pain Onset More than a month ago                         [facility] Adult PT Treatment/Exercise - 10/10/16 0001      Manual Therapy   Manual Lymphatic Drainage (MLD) In Supine: Short neck, superficial and deep abdominals, Rt inguinal nodes and Rt axillo-inguinal anastomosis, Lt axillary nodes, Lt upper anterior quadrant intact sequence, anterior inter-axillary anastomosis, and then Rt UE from dorsal hand to lateral shoulder working proximal from distal then retracing all steps.     Compression Bandaging Biotone lotion applied, thin stockinette, Elastomull to fingers 1-4 (left out 5th finger so pt can type at work); Artiflex, and then 1-6 cm, 1-10 cm, and 2-12 cm short stretch compression bandages from hand to axilla.                 PT Education - 10/10/16 0845    Education provided Yes   Education Details Shoulder cane exercises   Person(s) Educated Patient   Methods Explanation;Demonstration;Handout   Comprehension Returned demonstration;Verbalized understanding                [facility] Goals - 10/08/16 1053      CC Long Term Goal  #1   Title Patient will demonstrate 170 degrees of right shoulder flexion to allow her to reach items overhead   Baseline 140   Time 4   Period Weeks   Status New     CC Long Term Goal  #2   Title Patient will demonstrate 170 degrees of right shoulder abduction to allow her to reach items out to sides   Baseline 110   Time 4   Period Weeks   Status New     CC Long Term Goal  #3   Title Patient will demonstrate 60 degrees of right shoulder  internal rotation to allow her to don her bra without difficulty   Baseline 22   Time 4   Period Weeks   Status New     CC Long Term Goal  #4   Title Patient will obtain appropriate compression sleeve and glove for long term management of lymphedema   Time 4   Period Weeks   Status New     CC Long Term Goal  #5   Title Patient will demonstrate 75% of full wrist ROM to allow patient to return to prior level of function   Baseline ~25%   Time 4   Period Weeks   Status New     CC Long Term Goal  #6   Title Patient will be independent in self manual lymphatic drainage for long term management of RUE lymphedema   Time 4   Period Weeks   Status New     Additional Goals   Additional Goals Yes            Plan - 10/10/16 1252    Clinical Impression Statement Tried donning bandages a bit looser to increase compliance.  No change noted from eval. She  will benefit from continued PT to reduce swelling and also to address wrist and shoulder deficits.   Rehab Potential Good   Clinical Impairments Affecting Rehab Potential hx of radiation   PT Frequency 3x / week   PT Duration 4 weeks   PT Treatment/Interventions Vasopneumatic Device;Taping;Passive range of motion;Scar mobilization;Compression bandaging;Manual lymph drainage;Manual techniques;Patient/family education;Orthotic Fit/Training;Therapeutic exercise;DME Instruction;ADLs/Self Care Home Management   PT Next Visit Plan Continue lymphedema treatment and begin PROM right wrist; review cane exercises and issue wrist AROM exercises   PT Home Exercise Plan Shoulder flexion and abduction cane exercises   Consulted and Agree with Plan of Care Patient      Patient will benefit from skilled therapeutic intervention in order to improve the following deficits and impairments:  Pain, Decreased scar mobility, Decreased range of motion, Decreased strength, Impaired UE functional use, Increased edema  Visit Diagnosis: Postmastectomy lymphedema  Stiffness of right shoulder, not elsewhere classified  Stiffness of right wrist, not elsewhere classified  Chronic right shoulder pain     Problem List There are no active problems to display for this patient.   Annia Friendly, [place] 10/10/16 12:54 PM  [place], [place], [address] Phone: [phone]   Fax:  [phone]  Name: Tina Odonnell MRN: [id number] Date of Birth: [birth year]

## 2016-10-10 NOTE — Patient Instructions (Signed)
Flexion (Eccentric) - Active (Cane)    Lift cane with both hands. Avoid hiking shoulders. Lower cane slowly for 3-5 seconds. _10__ reps per set, __3_ sets per day, _7__ days per week.  http://ecce.exer.us/155   Copyright  VHI. All rights reserved.  Cane Exercise: Abduction    Hold cane with right hand over end, palm-up, with other hand palm-down. Move arm out from side and up by pushing with other arm. Hold __2-3__ seconds. Repeat __10__ times. Do _3___ sessions per day.  http://gt2.exer.us/82   Copyright  VHI. All rights reserved.

## 2016-10-14 ENCOUNTER — Ambulatory Visit: Payer: Managed Care, Other (non HMO) | Admitting: Physical Therapy

## 2016-10-17 ENCOUNTER — Ambulatory Visit: Payer: Managed Care, Other (non HMO)

## 2016-10-21 ENCOUNTER — Ambulatory Visit: Payer: Managed Care, Other (non HMO)

## 2016-10-23 ENCOUNTER — Ambulatory Visit: Payer: Managed Care, Other (non HMO) | Attending: Sports Medicine

## 2016-10-23 DIAGNOSIS — I972 Postmastectomy lymphedema syndrome: Secondary | ICD-10-CM | POA: Diagnosis present

## 2016-10-23 DIAGNOSIS — M25631 Stiffness of right wrist, not elsewhere classified: Secondary | ICD-10-CM | POA: Insufficient documentation

## 2016-10-23 DIAGNOSIS — M25511 Pain in right shoulder: Secondary | ICD-10-CM | POA: Diagnosis present

## 2016-10-23 DIAGNOSIS — G8929 Other chronic pain: Secondary | ICD-10-CM | POA: Diagnosis present

## 2016-10-23 DIAGNOSIS — M25611 Stiffness of right shoulder, not elsewhere classified: Secondary | ICD-10-CM | POA: Diagnosis present

## 2016-10-23 NOTE — Patient Instructions (Addendum)
Cancer Rehab [phone] Start with circles at the neck with hands above collarbones and circles directed towards the neck 5 times. Deep Effective Breath   Standing, sitting, or laying down, place both hands on the belly. Take a deep breath IN, expanding the belly; then breath OUT, contracting the belly. Repeat __5__ times. Do __2-3__ sessions per day and before your self massage.  http://gt2.exer.us/866   Copyright  VHI. All rights reserved.  Axilla to Axilla - Sweep   On uninvolved side make 5 circles in the armpit, then pump _5__ times from involved armpit across chest to uninvolved armpit, making a pathway. Do _1__ time per day.  Copyright  VHI. All rights reserved.  Axilla to Inguinal Nodes - Sweep   On involved side, make 5 circles at groin at panty line, then pump _5__ times from armpit along side of trunk to outer hip, making your other pathway. Do __1_ time per day.  Copyright  VHI. All rights reserved.  Arm Posterior: Elbow to Shoulder - Sweep   Pump _5__ times from back of elbow to top of shoulder. Then inner to outer upper arm _5_ times, then outer arm again _5_ times. Then back to the pathways _2-3_ times. Do _1__ time per day.  Copyright  VHI. All rights reserved.  ARM: [place] Wrist to Elbow - Sweep   Pump or stationary circles _5__ times from wrist to elbow making sure to do both sides of the forearm. Then retrace your steps to the outer arm, and the pathways _2-3_ times each. Do _1__ time per day.  Copyright  VHI. All rights reserved.  ARM: Dorsum of Hand to Shoulder - Sweep   Pump or stationary circles _5__ times on back of hand including knuckle spaces and individual fingers if needed working up towards the wrist, then retrace all your steps working back up the forearm, doing both sides; upper outer arm and back to your pathways _2-3_ times each. Then do 5 circles again at uninvolved armpit and involved groin where you started! Good job!! Do  __1_ time per day.  Copyright  VHI. All rights reserved.    Forearm Pronation / Supination: Resisted (Sitting)    With right forearm supported, grasp object and gently rotate palm up, then down, as far as possible without pain. Repeat _10___ times per set. Do _2-3___ sets per session. Do __2__ sessions per day.  Copyright  VHI. All rights reserved.   AROM: Wrist Flexion    With right palm up, bend wrist up. Hold theraband with knot fixated under foot. Repeat _10___ times per set. Do _1-2___ sets per session. Do __2__ sessions per day.  Copyright  VHI. All rights reserved.   AROM: Wrist Extension    With right palm down, bend wrist up. Hold theraband with knot fixated under foot.  Repeat __10__ times per set. Do __1-2__ sets per session. Do _2___ sessions per day.  Copyright  VHI. All rights reserved.

## 2016-10-23 NOTE — Therapy (Signed)
[place], [place], [address] Phone: [phone]   Fax:  [phone]  Physical Therapy Treatment  Patient Details  Name: Tina Odonnell MRN: [id number] Date of Birth: 1956/02/17 Referring Provider: Deneen Harts  Encounter Date: 10/23/2016      PT End of Session - 10/23/16 1708    Visit Number 3   Number of Visits 13   Date for PT Re-Evaluation 11/05/16   PT Start Time 1606   PT Stop Time 1701   PT Time Calculation (min) 55 min   Activity Tolerance Patient tolerated treatment well   Behavior During Therapy [facility] for tasks assessed/performed      Past Medical History:  Diagnosis Date  . Hypertension     No past surgical history on file.  There were no vitals filed for this visit.      Subjective Assessment - 10/23/16 1609    Subjective I am able to fit into my sleeve again though the wrist is uncomfortable, just feels too tight, but my hand is not swelling.  Been working on my ROM with the cane and that's made my pain much better as well and squeezing the ball has helped my grip (pt recalls her grip being 10 #, today was 18#).    Pertinent History R breast cancer with R mastectomy in 2006, completed chemotherapy and radiation, penicillin allergy - causes renal failure, fractured R radius in July 2017 - surgery Aug 2017, hypertension, R TKA 2011   Patient Stated Goals to reduce pain in RUE   Currently in Pain? No/denies               LYMPHEDEMA/ONCOLOGY QUESTIONNAIRE - 10/23/16 1611      Right Upper Extremity Lymphedema   15 cm Proximal to Olecranon Process 38.8 cm   Olecranon Process 31.4 cm   15 cm Proximal to Ulnar Styloid Process 30.2 cm   10 cm Proximal to Ulnar Styloid Process 26.2 cm   Just Proximal to Ulnar Styloid Process 16.6 cm   Across Hand at [employer] 18 cm   At [place] of 2nd Digit 6.2 cm                  [facility] Adult PT Treatment/Exercise - 10/23/16 0001       Wrist Exercises   Wrist Flexion Strengthening;Right;5 reps;Seated;Theraband   Theraband Level (Wrist Flexion) Level 2 (Red)   Wrist Extension Strengthening;Right;5 reps;Seated;Theraband   Theraband Level (Wrist Extension) Level 2 (Red)     Manual Therapy   Manual Lymphatic Drainage (MLD) In Supine: Short neck, superficial and deep abdominals, Rt inguinal nodes and Rt axillo-inguinal anastomosis, Lt axillary nodes, Lt upper anterior quadrant intact sequence, anterior inter-axillary anastomosis, and then Rt UE from dorsal hand to lateral shoulder working proximal from distal then retracing all steps.                 PT Education - 10/23/16 1706    Education provided Yes   Education Details Instructed pt in self manual lymph drainage and wrist strengthening exercises   Person(s) Educated Patient   Methods Explanation;Demonstration;Handout   Comprehension Verbalized understanding;Returned demonstration;Need further instruction                [facility] Goals - 10/23/16 1656      CC Long Term Goal  #1   Title Patient will demonstrate 170 degrees of right shoulder flexion to allow her to reach items overhead  Baseline 140; 150 degrees 10/23/16   Status On-going     CC Long Term Goal  #2   Title Patient will demonstrate 170 degrees of right shoulder abduction to allow her to reach items out to sides   Baseline 110; 116 degrees 10/23/16   Status On-going     CC Long Term Goal  #3   Title Patient will demonstrate 60 degrees of right shoulder internal rotation to allow her to don her bra without difficulty   Baseline 22; 40 degrees 10/23/16   Status On-going     CC Long Term Goal  #4   Title Patient will obtain appropriate compression sleeve and glove for long term management of lymphedema   Baseline Pt was issued script for doctor to sign and instructed to go to a medical supply store near her (in [place])-10/23/16   Status Partially Met     CC Long Term  Goal  #5   Title Patient will demonstrate 75% of full wrist ROM to allow patient to return to prior level of function   Status On-going     CC Long Term Goal  #6   Title Patient will be independent in self manual lymphatic drainage for long term management of RUE lymphedema   Baseline Instructed pt in this today and she verbalized a good understanding-10/23/16   Status Partially Met            Plan - 10/23/16 1709    Clinical Impression Statement At beginning of session pt was thinking she wanted to D/C since she feels she is doing so much better regarding her lymphedema, but when her A/ROM measurements were better but still pretty limited she decided she wants to still come but focus now on her ROM. She is improving well overall, issued strengthening exercises for her wrist today and she is to cont AA/ROM stretching with dowel. Her compression sleeve was a good fit and overall her ciurcumference measurements have reduced well except at her mid forearm though she reports was writing/typing alot at work today which flares her swelling up. Issued prescription for her doctor to sign for a new compression sleeve and glove if she wants though she reports her hand has been doing really well. Also added compression bra to script per pts request as she reports to having near Rt axilla/lateral trunk swelling.    Rehab Potential Good   Clinical Impairments Affecting Rehab Potential hx of radiation   PT Frequency 3x / week   PT Duration 4 weeks   PT Treatment/Interventions Vasopneumatic Device;Taping;Passive range of motion;Scar mobilization;Compression bandaging;Manual lymph drainage;Manual techniques;Patient/family education;Orthotic Fit/Training;Therapeutic exercise;DME Instruction;ADLs/Self Care Home Management   PT Next Visit Plan Unless issue arises pt is sastisfied with treatment of lymphedema at this time. Focus on Rt shoulder and wrist deficits per original POC, review HEP and progress pt prn.     PT Home Exercise Plan Shoulder flexion and abduction cane exercises and new wrist strengthening exercises issued today.   Consulted and Agree with Plan of Care Patient      Patient will benefit from skilled therapeutic intervention in order to improve the following deficits and impairments:  Pain, Decreased scar mobility, Decreased range of motion, Decreased strength, Impaired UE functional use, Increased edema  Visit Diagnosis: Postmastectomy lymphedema  Stiffness of right shoulder, not elsewhere classified  Stiffness of right wrist, not elsewhere classified  Chronic right shoulder pain     Problem List There are no active problems to display for this patient.  Otelia Limes, PTA 10/23/2016, 5:18 PM  [place], [place], [address] Phone: [phone]   Fax:  [phone]  Name: BELLAMI FARRELLY MRN: [id number] Date of Birth: 08/11/1956

## 2016-10-25 ENCOUNTER — Ambulatory Visit: Payer: Managed Care, Other (non HMO) | Admitting: Physical Therapy

## 2016-10-29 ENCOUNTER — Ambulatory Visit: Payer: Managed Care, Other (non HMO) | Admitting: Physical Therapy

## 2016-10-29 DIAGNOSIS — I972 Postmastectomy lymphedema syndrome: Secondary | ICD-10-CM | POA: Diagnosis not present

## 2016-10-29 DIAGNOSIS — M25611 Stiffness of right shoulder, not elsewhere classified: Secondary | ICD-10-CM

## 2016-10-29 DIAGNOSIS — M25511 Pain in right shoulder: Secondary | ICD-10-CM

## 2016-10-29 DIAGNOSIS — G8929 Other chronic pain: Secondary | ICD-10-CM

## 2016-10-29 NOTE — Patient Instructions (Addendum)
Over Head Pull: Narrow Grip       On back, knees bent, feet flat, band across thighs, elbows straight but relaxed. Pull hands apart (start). Keeping elbows straight, bring arms up and over head, hands toward floor. Keep pull steady on band. Hold momentarily. Return slowly, keeping pull steady, back to start. Repeat _5-10__ times. Band color yellow______   Side Pull: Double Arm   On back, knees bent, feet flat. Arms perpendicular to body, shoulder level, elbows straight but relaxed. Pull arms out to sides, elbows straight. Resistance band comes across collarbones, hands toward floor. Hold momentarily. Slowly return to starting position. Repeat _5-10__ times. Band color _yellow____   Sash   On back, knees bent, feet flat, left hand on left hip, right hand above left. Pull right arm DIAGONALLY (hip to shoulder) across chest. Bring right arm along head toward floor. Hold momentarily. Slowly return to starting position. Repeat ___ times. Do with left arm. Band color ______   Shoulder Rotation: Double Arm   On back, knees bent, feet flat, elbows tucked at sides, bent 90, hands palms up. Pull hands apart and down toward floor, keeping elbows near sides. Hold momentarily. Slowly return to starting position. Repeat 5-10_ times. Band color yellow ______  Strengthening: Isometric Extension    Using wall for resistance, press back of left arm into ball using light pressure. Hold __5__ seconds. Repeat _5-10___ times per set.  http://orth.exer.us/805   Copyright  VHI. All rights reserved.  SHOULDER: Abduction (Isometric)    Use wall as resistance. Press arm against pillow. Keep elbow straight. Hold _5__ seconds. _5-10_ reps per set,  Copyright  VHI. All rights reserved.  Flexion (Isometric)    Press right fist against wall. Hold __5__ seconds. Repeat __5-10__ times.  http://gt2.exer.us/114   Copyright  VHI. All rights reserved.

## 2016-10-29 NOTE — Therapy (Signed)
[place], [place], [address] Phone: [phone]   Fax:  [phone]  Physical Therapy Treatment  Patient Details  Name: Tina Odonnell MRN: [id number] Date of Birth: 1955-12-28 Referring Provider: Deneen Harts  Encounter Date: 10/29/2016      PT End of Session - 10/29/16 1753    Visit Number 4   Number of Visits 13   Date for PT Re-Evaluation 11/05/16   PT Start Time 1300   PT Stop Time 1345   PT Time Calculation (min) 45 min   Activity Tolerance Patient tolerated treatment well   Behavior During Therapy [facility] for tasks assessed/performed      Past Medical History:  Diagnosis Date  . Hypertension     No past surgical history on file.  There were no vitals filed for this visit.      Subjective Assessment - 10/29/16 1307    Subjective Tina Odonnell came out today and measured for my pump. She had a trial and and will getting a pump.  Pt says the pain in her arm is getting better and that was her initial goal She feels that she still wants to work on her wrist range of motion and grip strength .    Pertinent History R breast cancer with R mastectomy in 2006, completed chemotherapy and radiation, penicillin allergy - causes renal failure, fractured R radius in July 2017 - surgery Aug 2017, hypertension, R TKA 2011   Patient Stated Goals to reduce pain in RUE  on 11/14 she says her goal is now to increase wrist range of motion and wrist strength    Currently in Pain? Yes   Pain Score 6    Pain Location Shoulder   Pain Orientation Right   Pain Type Chronic pain  since july 29                         [facility] Adult PT Treatment/Exercise - 10/29/16 0001      Elbow Exercises   Forearm Supination Strengthening;Right;10 reps  velcro board and 2# weight      Shoulder Exercises: Supine   Horizontal ABduction Strengthening;Both;5 reps;Theraband   Theraband Level (Shoulder Horizontal  ABduction) Level 1 (Yellow)   External Rotation Strengthening;Both;5 reps;Theraband   Theraband Level (Shoulder External Rotation) Level 1 (Yellow)   Flexion Strengthening;Both;5 reps;Theraband  narrow and wide grip    Theraband Level (Shoulder Flexion) Level 1 (Yellow)   Other Supine Exercises diagonal elevation with yellow theraband x 5 reps with each arm      Shoulder Exercises: Standing   Flexion Strengthening;Right;5 reps  isometric  5 sec holds x 3 reps    ABduction Strengthening;Right;5 reps  isometric 3  5 sec holds   Extension Strengthening;Right  isometric 3 reps of 5 sec holds     Wrist Exercises   Other wrist exercises velcro board x 5 minutes                 PT Education - 10/29/16 1753    Education provided Yes   Education Details scapular and shoulder strengthening exercise    Person(s) Educated Patient   Methods Explanation;Demonstration;Handout   Comprehension Returned demonstration                [facility] Goals - 10/23/16 1656      CC Long Term Goal  #1   Title Patient will demonstrate 170 degrees of right shoulder flexion to  allow her to reach items overhead   Baseline 140; 150 degrees 10/23/16   Status On-going     CC Long Term Goal  #2   Title Patient will demonstrate 170 degrees of right shoulder abduction to allow her to reach items out to sides   Baseline 110; 116 degrees 10/23/16   Status On-going     CC Long Term Goal  #3   Title Patient will demonstrate 60 degrees of right shoulder internal rotation to allow her to don her bra without difficulty   Baseline 22; 40 degrees 10/23/16   Status On-going     CC Long Term Goal  #4   Title Patient will obtain appropriate compression sleeve and glove for long term management of lymphedema   Baseline Pt was issued script for doctor to sign and instructed to go to a medical supply store near her (in [place])-10/23/16   Status Partially Met     CC Long Term Goal  #5   Title  Patient will demonstrate 75% of full wrist ROM to allow patient to return to prior level of function   Status On-going     CC Long Term Goal  #6   Title Patient will be independent in self manual lymphatic drainage for long term management of RUE lymphedema   Baseline Instructed pt in this today and she verbalized a good understanding-10/23/16   Status Partially Met            Plan - 10/29/16 1754    Clinical Impression Statement Pt excited about getting Flexitouch process started. She wants to keep working on strengtheing her right arm and get rid of the shoulder pain. Upgraded her program for scapular strength and shoulder isometrics as well as wrist and hand strength.  Pt happy to be progressing and is anxious to follow through with exercise at home    Rehab Potential Good   Clinical Impairments Affecting Rehab Potential hx of radiation   PT Frequency 3x / week   PT Duration 4 weeks   PT Treatment/Interventions Vasopneumatic Device;Taping;Passive range of motion;Scar mobilization;Compression bandaging;Manual lymph drainage;Manual techniques;Patient/family education;Orthotic Fit/Training;Therapeutic exercise;DME Instruction;ADLs/Self Care Home Management   PT Next Visit Plan  assess HEP and progress pt prn. Update goals. focus on shoulder,wrist and hand strength.  check pain of right shoulder    Consulted and Agree with Plan of Care Patient      Patient will benefit from skilled therapeutic intervention in order to improve the following deficits and impairments:  Pain, Decreased scar mobility, Decreased range of motion, Decreased strength, Impaired UE functional use, Increased edema  Visit Diagnosis: Postmastectomy lymphedema  Stiffness of right shoulder, not elsewhere classified  Chronic right shoulder pain     Problem List There are no active problems to display for this patient.  [name]. Owens Shark PT  Norwood Levo 10/29/2016, 5:58 PM  [place], [place], [address] Phone: [phone]   Fax:  [phone]  Name: Tina Odonnell MRN: [id number] Date of Birth: 10-29-1956

## 2016-11-01 ENCOUNTER — Encounter: Payer: Managed Care, Other (non HMO) | Admitting: Physical Therapy

## 2016-11-04 ENCOUNTER — Ambulatory Visit: Payer: Managed Care, Other (non HMO)

## 2016-11-04 DIAGNOSIS — I972 Postmastectomy lymphedema syndrome: Secondary | ICD-10-CM

## 2016-11-04 DIAGNOSIS — M25611 Stiffness of right shoulder, not elsewhere classified: Secondary | ICD-10-CM

## 2016-11-04 NOTE — Therapy (Addendum)
[place], [place], [address] Phone: [phone]   Fax:  [phone]  Physical Therapy Treatment  Patient Details  Name: Tina Odonnell MRN: [id number] Date of Birth: [birth year] Referring Provider: Deneen Harts  Encounter Date: 11/04/2016      PT End of Session - 11/04/16 0849    Visit Number 5   Number of Visits 13   Date for PT Re-Evaluation 11/05/16   PT Start Time 0809  Pt arrived late   PT Stop Time 0848   PT Time Calculation (min) 39 min   Activity Tolerance Patient tolerated treatment well   Behavior During Therapy [facility] for tasks assessed/performed      Past Medical History:  Diagnosis Date  . Hypertension     No past surgical history on file.  There were no vitals filed for this visit.      Subjective Assessment - 11/04/16 0812    Subjective I want to work on my lymphedema today. Though I've worn my new sleeve every dau since I got it last Tues or Thurs my arm still feels swollen this morning. I did stay pretty busy over the weekend though. My Rt shoulder is actually feeling pretty good, just a little sore from the increased swelling.     Pertinent History Rt breast cancer with Rt mastectomy in 2006, completed chemotherapy and radiation, penicillin allergy - causes renal failure, fractured R radius in July 2017 - surgery Aug 2017, hypertension, R TKA 2011   Patient Stated Goals to reduce pain in RUE  on 11/14 she says her goal is now to increase wrist range of motion and wrist strength    Currently in Pain? No/denies                         [facility] Adult PT Treatment/Exercise - 11/04/16 0001      Manual Therapy   Manual Lymphatic Drainage (MLD) In Supine: Short neck, superficial and deep abdominals, Rt inguinal nodes and Rt axillo-inguinal anastomosis, Lt axillary nodes, Lt upper anterior quadrant intact sequence, anterior inter-axillary anastomosis, and then Rt UE from  dorsal hand to lateral shoulder working proximal from distal then retracing all steps.                         [facility] Goals - 11/04/16 0954      CC Long Term Goal  #1   Title Patient will demonstrate 170 degrees of right shoulder flexion to allow her to reach items overhead   Baseline 140; 150 degrees 10/23/16   Status On-going     CC Long Term Goal  #2   Title Patient will demonstrate 170 degrees of right shoulder abduction to allow her to reach items out to sides   Baseline 110; 116 degrees 10/23/16   Status On-going     CC Long Term Goal  #3   Title Patient will demonstrate 60 degrees of right shoulder internal rotation to allow her to don her bra without difficulty   Baseline 22; 40 degrees 10/23/16   Status On-going     CC Long Term Goal  #4   Title Patient will obtain appropriate compression sleeve and glove for long term management of lymphedema   Baseline Pt was issued script for doctor to sign and instructed to go to a medical supply store near her (in [place])-10/23/16; pt go this last week-11/04/16  Status Achieved     CC Long Term Goal  #5   Title Patient will demonstrate 75% of full wrist ROM to allow patient to return to prior level of function   Status On-going     CC Long Term Goal  #6   Title Patient will be independent in self manual lymphatic drainage for long term management of RUE lymphedema   Baseline Instructed pt in this today and she verbalized a good understanding-10/23/16   Status Partially Met            Plan - 11/04/16 0849    Clinical Impression Statement Pt reported wantingto focus on manual lymph drainage today as she felt her arm was more swollen increasing her shoulder soreness, but no pain today. She was planning to put her sleeve back on after session. Educated her that once she gets her pump if she still has an issue with her swelling continuing with compliance of wear she should look into a Class II compression  in the future.    Rehab Potential Good   Clinical Impairments Affecting Rehab Potential hx of radiation   PT Frequency 3x / week   PT Duration 4 weeks   PT Treatment/Interventions Vasopneumatic Device;Taping;Passive range of motion;Scar mobilization;Compression bandaging;Manual lymph drainage;Manual techniques;Patient/family education;Orthotic Fit/Training;Therapeutic exercise;DME Instruction;ADLs/Self Care Home Management   PT Next Visit Plan Measure ROM/update goals. Assess HEP and progress pt prn. focus on shoulder,wrist and hand strength; lymphedema prn   PT Home Exercise Plan Shoulder flexion and abduction cane exercises and new wrist strengthening exercises issued today.   Consulted and Agree with Plan of Care Patient      Patient will benefit from skilled therapeutic intervention in order to improve the following deficits and impairments:  Pain, Decreased scar mobility, Decreased range of motion, Decreased strength, Impaired UE functional use, Increased edema  Visit Diagnosis: Postmastectomy lymphedema  Stiffness of right shoulder, not elsewhere classified     Problem List There are no active problems to display for this patient.   Otelia Limes, PTA 11/04/2016, 9:56 AM  [place], [place], [address] Phone: [phone]   Fax:  [phone]  Name: Tina Odonnell MRN: [id number] Date of Birth: 19-Aug-1956   PHYSICAL THERAPY DISCHARGE SUMMARY  Visits from Start of Care: 5  Current functional level related to goals / functional outcomes: unknown   Remaining deficits: unknown   Education / Equipment: As above  Plan: Patient agrees to discharge.  Patient goals were partially met. Patient is being discharged due to not returning since the last visit.  ?????    Maudry Diego, PT 07/22/18 11:56 AM

## 2020-08-23 DIAGNOSIS — E042 Nontoxic multinodular goiter: Secondary | ICD-10-CM | POA: Diagnosis not present

## 2020-08-23 DIAGNOSIS — C50911 Malignant neoplasm of unspecified site of right female breast: Secondary | ICD-10-CM | POA: Diagnosis not present

## 2020-08-23 DIAGNOSIS — F3341 Major depressive disorder, recurrent, in partial remission: Secondary | ICD-10-CM | POA: Diagnosis not present

## 2020-08-23 DIAGNOSIS — R9431 Abnormal electrocardiogram [ECG] [EKG]: Secondary | ICD-10-CM | POA: Diagnosis not present

## 2020-08-23 DIAGNOSIS — E559 Vitamin D deficiency, unspecified: Secondary | ICD-10-CM | POA: Diagnosis not present

## 2020-08-23 DIAGNOSIS — Z6836 Body mass index (BMI) 36.0-36.9, adult: Secondary | ICD-10-CM | POA: Diagnosis not present

## 2020-08-23 DIAGNOSIS — M5030 Other cervical disc degeneration, unspecified cervical region: Secondary | ICD-10-CM | POA: Diagnosis not present

## 2020-08-23 DIAGNOSIS — R7301 Impaired fasting glucose: Secondary | ICD-10-CM | POA: Diagnosis not present

## 2020-08-23 DIAGNOSIS — Z0000 Encounter for general adult medical examination without abnormal findings: Secondary | ICD-10-CM | POA: Diagnosis not present

## 2020-08-23 DIAGNOSIS — K219 Gastro-esophageal reflux disease without esophagitis: Secondary | ICD-10-CM | POA: Diagnosis not present

## 2020-08-23 DIAGNOSIS — Z23 Encounter for immunization: Secondary | ICD-10-CM | POA: Diagnosis not present

## 2020-08-23 DIAGNOSIS — I10 Essential (primary) hypertension: Secondary | ICD-10-CM | POA: Diagnosis not present

## 2020-08-23 DIAGNOSIS — Z136 Encounter for screening for cardiovascular disorders: Secondary | ICD-10-CM | POA: Diagnosis not present

## 2020-08-23 DIAGNOSIS — Z853 Personal history of malignant neoplasm of breast: Secondary | ICD-10-CM | POA: Diagnosis not present

## 2020-08-23 DIAGNOSIS — Z1211 Encounter for screening for malignant neoplasm of colon: Secondary | ICD-10-CM | POA: Diagnosis not present

## 2021-02-07 DIAGNOSIS — H2513 Age-related nuclear cataract, bilateral: Secondary | ICD-10-CM | POA: Diagnosis not present

## 2021-11-02 DIAGNOSIS — Z853 Personal history of malignant neoplasm of breast: Secondary | ICD-10-CM | POA: Diagnosis not present

## 2021-11-02 DIAGNOSIS — Z0000 Encounter for general adult medical examination without abnormal findings: Secondary | ICD-10-CM | POA: Diagnosis not present

## 2021-11-02 DIAGNOSIS — E042 Nontoxic multinodular goiter: Secondary | ICD-10-CM | POA: Diagnosis not present

## 2021-11-02 DIAGNOSIS — F3341 Major depressive disorder, recurrent, in partial remission: Secondary | ICD-10-CM | POA: Diagnosis not present

## 2021-11-02 DIAGNOSIS — I10 Essential (primary) hypertension: Secondary | ICD-10-CM | POA: Diagnosis not present

## 2021-11-02 DIAGNOSIS — I890 Lymphedema, not elsewhere classified: Secondary | ICD-10-CM | POA: Diagnosis not present

## 2021-11-02 DIAGNOSIS — R7301 Impaired fasting glucose: Secondary | ICD-10-CM | POA: Diagnosis not present

## 2021-11-02 DIAGNOSIS — E559 Vitamin D deficiency, unspecified: Secondary | ICD-10-CM | POA: Diagnosis not present

## 2021-11-02 DIAGNOSIS — K219 Gastro-esophageal reflux disease without esophagitis: Secondary | ICD-10-CM | POA: Diagnosis not present

## 2022-06-28 ENCOUNTER — Ambulatory Visit
Admission: EM | Admit: 2022-06-28 | Discharge: 2022-06-28 | Disposition: A | Discharge status: Home / Self Care | Payer: PPO | Attending: Student | Admitting: Student

## 2022-06-28 DIAGNOSIS — T63461A Toxic effect of venom of wasps, accidental (unintentional), initial encounter: Secondary | ICD-10-CM

## 2022-06-28 DIAGNOSIS — R220 Localized swelling, mass and lump, head: Secondary | ICD-10-CM | POA: Diagnosis not present

## 2022-06-28 MED ORDER — PREDNISONE 20 MG PO TABS
40.0000 mg | ORAL_TABLET | Freq: Every day | ORAL | 0 refills | Status: AC
Start: 2022-06-28 — End: 2022-07-01

## 2022-06-28 MED ORDER — METHYLPREDNISOLONE SODIUM SUCC 125 MG IJ SOLR
60.0000 mg | Freq: Once | INTRAMUSCULAR | Status: AC
  Administered 2022-06-28: 60 mg via INTRAMUSCULAR

## 2022-06-28 NOTE — Discharge Instructions (Addendum)
-  Starting tomorrow - prednisone two pills taken in the morning x3 days -Benedryl (diphenhydramine) 25-'50mg'$  (1-2 pills) as needed for itching, up to every 6 hours.  This medication will cause drowsiness. -Follow-up if symptoms worsen instead of improve - facial swelling, new shortness of breath, etc

## 2022-06-28 NOTE — ED Provider Notes (Signed)
[facility]    CSN: [id number] Arrival date & time: 06/28/22  1025      History   Chief Complaint Chief Complaint  Patient presents with   yellow jacket sting    HPI Tina Odonnell is a 66 y.o. female presenting with L forehead swelling following yellowjacket sting x2 days. History noncontributory.  States has attempted Benadryl with minimal relief.  States she has never had this reaction before, but she thinks the yellowjacket got stuck in her hat before she could kill it.  Denies shortness of breath, chest pain, sensation of throat closing.  HPI  Past Medical History:  Diagnosis Date   Hypertension     There are no problems to display for this patient.   History reviewed. No pertinent surgical history.  OB History   No obstetric history on file.      Home Medications    Prior to Admission medications   Medication Sig Start Date End Date Taking? Authorizing Provider  predniSONE (DELTASONE) 20 MG tablet Take 2 tablets (40 mg total) by mouth daily for 3 days. 06/28/22 07/01/22 Yes Hazel Sams, PA-C  albuterol (PROVENTIL HFA;VENTOLIN HFA) 108 (90 BASE) MCG/ACT inhaler Inhale 2 puffs into the lungs every 4 (four) hours as needed for wheezing or shortness of breath.    [provider]  calcium acetate (PHOSLO) 667 MG capsule Take by mouth once.    [provider]  Cholecalciferol (VITAMIN D PO) Take 1 tablet by mouth daily.    [provider]  escitalopram (LEXAPRO) 10 MG tablet Take 10 mg by mouth daily.    [provider]  LORazepam (ATIVAN) 0.5 MG tablet Take 0.5 mg by mouth daily.    [provider]  magnesium 30 MG tablet Take 30 mg by mouth once.    [provider]  Multiple Vitamin (MULTIVITAMIN WITH MINERALS) TABS Take 1 tablet by mouth daily.    [provider]  olmesartan (BENICAR) 5 MG tablet Take 5 mg by mouth daily.    [provider]  VITAMIN A PO Take 1,000 Units by mouth  daily.    [provider]    Family History History reviewed. No pertinent family history.  Social History Social History   Tobacco Use   Smoking status: Never  Substance Use Topics   Alcohol use: Yes    Comment: occ   Drug use: No     Allergies   Sulfa antibiotics   Review of Systems Review of Systems  Skin:  Positive for color change.  All other systems reviewed and are negative.    Physical Exam Triage Vital Signs ED Triage Vitals  Enc Vitals Group     BP 06/28/22 1110 140/80     Pulse Rate 06/28/22 1110 67     Resp 06/28/22 1110 18     Temp 06/28/22 1110 98 F (36.7 C)     Temp Source 06/28/22 1110 Oral     SpO2 06/28/22 1110 97 %     Weight --      Height --      Head Circumference --      Peak Flow --      Pain Score 06/28/22 1111 0     Pain Loc --      Pain Edu? --      Excl. in [place]? --    No data found.  Updated Vital Signs BP 140/80 (BP Location: Left Arm)   Pulse 67  Temp 98 F (36.7 C) (Oral)   Resp 18   SpO2 97%   Visual Acuity Right Eye Distance:   Left Eye Distance:   Bilateral Distance:    Right Eye Near:   Left Eye Near:    Bilateral Near:     Physical Exam Vitals reviewed.  Constitutional:      General: She is not in acute distress.    Appearance: Normal appearance. She is not ill-appearing or diaphoretic.     Comments: Ambulates to room unassisted and in no distress.   HENT:     Head: Normocephalic and atraumatic.     Comments: L forehead with 4x4cm area of localized angioedema. The bilateral upper eyelids and both mildly swollen, but pt can fully open her eyes. No lip, tongue, uvula swelling. Airway patent.   Cardiovascular:     Rate and Rhythm: Normal rate and regular rhythm.     Heart sounds: Normal heart sounds.  Pulmonary:     Effort: Pulmonary effort is normal.     Breath sounds: Normal breath sounds.  Skin:    General: Skin is warm.  Neurological:     General: No focal deficit present.     Mental  Status: She is alert and oriented to person, place, and time.  Psychiatric:        Mood and Affect: Mood normal.        Behavior: Behavior normal.        Thought Content: Thought content normal.        Judgment: Judgment normal.      UC Treatments / Results  Labs (all labs ordered are listed, but only abnormal results are displayed) Labs Reviewed - No data to display  EKG   Radiology No results found.  Procedures Procedures (including critical care time)  Medications Ordered in UC Medications  methylPREDNISolone sodium succinate (SOLU-MEDROL) 125 mg/2 mL injection 60 mg (60 mg Intramuscular Given 06/28/22 1126)    Initial Impression / Assessment and Plan / UC Course  I have reviewed the triage vital signs and the nursing notes.  Pertinent labs & imaging results that were available during my care of the patient were reviewed by me and considered in my medical decision making (see chart for details).     This patient is a very pleasant 66 y.o. year old female presenting with localized angioedema following yellowjacket sting. Her forehead and eyelids are swollen, but there is no lip/tongue/uvula involvement; airway is patent. Denies SOB, CP. IM solumedrol administered today, and short course sent to start tomorrow. Also rec benedryl. ED return precautions discussed. Patient verbalizes understanding and agreement.   Final Clinical Impressions(s) / UC Diagnoses   Final diagnoses:  Facial swelling  Yellow jacket sting, accidental or unintentional, initial encounter     Discharge Instructions      -Starting tomorrow - prednisone two pills taken in the morning x3 days -Benedryl (diphenhydramine) 25-'50mg'$  (1-2 pills) as needed for itching, up to every 6 hours.  This medication will cause drowsiness. -Follow-up if symptoms worsen instead of improve - facial swelling, new shortness of breath, etc    ED Prescriptions     Medication Sig Dispense Auth. Provider   predniSONE  (DELTASONE) 20 MG tablet Take 2 tablets (40 mg total) by mouth daily for 3 days. 6 tablet Hazel Sams, PA-C      PDMP not reviewed this encounter.   Hazel Sams, PA-C 06/28/22 1227

## 2022-06-28 NOTE — ED Triage Notes (Signed)
Pt c/o yellow jacket sting to left forehead yesterday. States had two benadryl after the bite but woke up w/ facial erythema and edema, to forehead and bilat periorbital areas. Denies sob or dyspnea.
